# Patient Record
Sex: Female | Born: 1950 | Race: White | Hispanic: No | Marital: Married
Health system: Southern US, Community
[De-identification: ages and names within clinical notes are randomized; demographics above are authoritative.]

---

## 2017-04-26 DIAGNOSIS — H40013 Open angle with borderline findings, low risk, bilateral: Secondary | ICD-10-CM | POA: Diagnosis not present

## 2017-07-11 DIAGNOSIS — H401131 Primary open-angle glaucoma, bilateral, mild stage: Secondary | ICD-10-CM | POA: Diagnosis not present

## 2017-07-30 DIAGNOSIS — H401131 Primary open-angle glaucoma, bilateral, mild stage: Secondary | ICD-10-CM | POA: Diagnosis not present

## 2017-10-29 DIAGNOSIS — H401131 Primary open-angle glaucoma, bilateral, mild stage: Secondary | ICD-10-CM | POA: Diagnosis not present

## 2018-04-15 ENCOUNTER — Other Ambulatory Visit: Payer: Self-pay | Admitting: Family Medicine

## 2018-04-15 ENCOUNTER — Other Ambulatory Visit (HOSPITAL_COMMUNITY)
Admission: RE | Admit: 2018-04-15 | Discharge: 2018-04-15 | Disposition: A | Discharge status: Home / Self Care | Payer: Medicare Other | Source: Ambulatory Visit | Attending: Family Medicine | Admitting: Family Medicine

## 2018-04-15 DIAGNOSIS — Z01411 Encounter for gynecological examination (general) (routine) with abnormal findings: Secondary | ICD-10-CM | POA: Insufficient documentation

## 2018-04-15 DIAGNOSIS — E2839 Other primary ovarian failure: Secondary | ICD-10-CM

## 2018-04-15 DIAGNOSIS — E041 Nontoxic single thyroid nodule: Secondary | ICD-10-CM | POA: Diagnosis not present

## 2018-04-15 DIAGNOSIS — Z1231 Encounter for screening mammogram for malignant neoplasm of breast: Secondary | ICD-10-CM

## 2018-04-15 DIAGNOSIS — Z0000 Encounter for general adult medical examination without abnormal findings: Secondary | ICD-10-CM | POA: Diagnosis not present

## 2018-04-15 DIAGNOSIS — Z23 Encounter for immunization: Secondary | ICD-10-CM | POA: Diagnosis not present

## 2018-04-15 DIAGNOSIS — Z136 Encounter for screening for cardiovascular disorders: Secondary | ICD-10-CM | POA: Diagnosis not present

## 2018-04-18 LAB — CYTOLOGY - PAP
Diagnosis: NEGATIVE
HPV: NOT DETECTED

## 2018-06-05 ENCOUNTER — Ambulatory Visit: Payer: Self-pay

## 2018-06-05 ENCOUNTER — Other Ambulatory Visit: Payer: Self-pay

## 2018-08-11 ENCOUNTER — Other Ambulatory Visit: Payer: 59

## 2018-08-11 ENCOUNTER — Ambulatory Visit: Payer: 59

## 2018-09-01 DIAGNOSIS — H401131 Primary open-angle glaucoma, bilateral, mild stage: Secondary | ICD-10-CM | POA: Diagnosis not present

## 2018-10-06 ENCOUNTER — Other Ambulatory Visit: Payer: Self-pay

## 2018-10-06 ENCOUNTER — Ambulatory Visit
Admission: RE | Admit: 2018-10-06 | Discharge: 2018-10-06 | Disposition: A | Discharge status: Home / Self Care | Payer: 59 | Source: Ambulatory Visit | Attending: Family Medicine | Admitting: Family Medicine

## 2018-10-06 DIAGNOSIS — M85852 Other specified disorders of bone density and structure, left thigh: Secondary | ICD-10-CM | POA: Diagnosis not present

## 2018-10-06 DIAGNOSIS — Z1231 Encounter for screening mammogram for malignant neoplasm of breast: Secondary | ICD-10-CM | POA: Diagnosis not present

## 2018-10-06 DIAGNOSIS — E2839 Other primary ovarian failure: Secondary | ICD-10-CM

## 2019-01-06 DIAGNOSIS — Z23 Encounter for immunization: Secondary | ICD-10-CM | POA: Diagnosis not present

## 2019-02-03 DIAGNOSIS — H2513 Age-related nuclear cataract, bilateral: Secondary | ICD-10-CM | POA: Diagnosis not present

## 2019-02-03 DIAGNOSIS — H401131 Primary open-angle glaucoma, bilateral, mild stage: Secondary | ICD-10-CM | POA: Diagnosis not present

## 2019-02-03 DIAGNOSIS — M7989 Other specified soft tissue disorders: Secondary | ICD-10-CM | POA: Diagnosis not present

## 2019-02-09 DIAGNOSIS — L608 Other nail disorders: Secondary | ICD-10-CM | POA: Diagnosis not present

## 2019-02-09 DIAGNOSIS — R200 Anesthesia of skin: Secondary | ICD-10-CM | POA: Diagnosis not present

## 2019-02-11 ENCOUNTER — Other Ambulatory Visit: Payer: Self-pay | Admitting: Orthopedic Surgery

## 2019-02-11 DIAGNOSIS — L608 Other nail disorders: Secondary | ICD-10-CM

## 2019-03-04 ENCOUNTER — Other Ambulatory Visit: Payer: PRIVATE HEALTH INSURANCE

## 2019-03-20 ENCOUNTER — Other Ambulatory Visit: Payer: Self-pay

## 2019-03-20 ENCOUNTER — Ambulatory Visit
Admission: RE | Admit: 2019-03-20 | Discharge: 2019-03-20 | Disposition: A | Discharge status: Home / Self Care | Payer: PRIVATE HEALTH INSURANCE | Source: Ambulatory Visit | Attending: Orthopedic Surgery | Admitting: Orthopedic Surgery

## 2019-03-20 DIAGNOSIS — L608 Other nail disorders: Secondary | ICD-10-CM

## 2019-03-20 MED ORDER — GADOBENATE DIMEGLUMINE 529 MG/ML IV SOLN
10.0000 mL | Freq: Once | INTRAVENOUS | Status: AC | PRN
  Administered 2019-03-20: 17:00:00 10 mL via INTRAVENOUS

## 2019-04-24 DIAGNOSIS — Z1211 Encounter for screening for malignant neoplasm of colon: Secondary | ICD-10-CM | POA: Diagnosis not present

## 2019-04-24 DIAGNOSIS — E7800 Pure hypercholesterolemia, unspecified: Secondary | ICD-10-CM | POA: Diagnosis not present

## 2019-04-24 DIAGNOSIS — Z0000 Encounter for general adult medical examination without abnormal findings: Secondary | ICD-10-CM | POA: Diagnosis not present

## 2019-04-24 DIAGNOSIS — Z79899 Other long term (current) drug therapy: Secondary | ICD-10-CM | POA: Diagnosis not present

## 2019-04-24 DIAGNOSIS — E041 Nontoxic single thyroid nodule: Secondary | ICD-10-CM | POA: Diagnosis not present

## 2019-05-08 DIAGNOSIS — Z79899 Other long term (current) drug therapy: Secondary | ICD-10-CM | POA: Diagnosis not present

## 2019-05-08 DIAGNOSIS — E041 Nontoxic single thyroid nodule: Secondary | ICD-10-CM | POA: Diagnosis not present

## 2019-05-08 DIAGNOSIS — E7800 Pure hypercholesterolemia, unspecified: Secondary | ICD-10-CM | POA: Diagnosis not present

## 2019-05-14 ENCOUNTER — Ambulatory Visit: Payer: PRIVATE HEALTH INSURANCE

## 2019-05-19 ENCOUNTER — Ambulatory Visit: Payer: PRIVATE HEALTH INSURANCE

## 2019-05-22 ENCOUNTER — Ambulatory Visit: Payer: PRIVATE HEALTH INSURANCE | Attending: Internal Medicine

## 2019-05-22 DIAGNOSIS — Z23 Encounter for immunization: Secondary | ICD-10-CM | POA: Insufficient documentation

## 2019-05-22 NOTE — Progress Notes (Signed)
   Covid-19 Vaccination Clinic  Name:  Allison Montgomery    MRN: [id number] DOB: 1951-02-20  05/22/2019  Allison Montgomery was observed post Covid-19 immunization for 15 minutes without incidence. She was provided with Vaccine Information Sheet and instruction to access the V-Safe system.   Allison Montgomery was instructed to call 911 with any severe reactions post vaccine: Marland Kitchen Difficulty breathing  . Swelling of your face and throat  . A fast heartbeat  . A bad rash all over your body  . Dizziness and weakness    Immunizations Administered    Name Date Dose VIS Date Route   Pfizer COVID-19 Vaccine 05/22/2019  4:33 PM 0.3 mL 03/27/2019 Intramuscular   Manufacturer: [place]   Lot: [id number]   [place]: [id number]

## 2019-06-01 DIAGNOSIS — R200 Anesthesia of skin: Secondary | ICD-10-CM | POA: Diagnosis not present

## 2019-06-01 DIAGNOSIS — L608 Other nail disorders: Secondary | ICD-10-CM | POA: Diagnosis not present

## 2019-06-16 ENCOUNTER — Ambulatory Visit: Payer: PRIVATE HEALTH INSURANCE | Attending: Internal Medicine

## 2019-06-16 DIAGNOSIS — Z23 Encounter for immunization: Secondary | ICD-10-CM | POA: Insufficient documentation

## 2019-06-16 NOTE — Progress Notes (Signed)
   Covid-19 Vaccination Clinic  Name:  Equilla Pyland    MRN: [id number] DOB: Oct 10, 1950  06/16/2019  Ms. Quest was observed post Covid-19 immunization for 15 minutes without incident. She was provided with Vaccine Information Sheet and instruction to access the V-Safe system.   Ms. Rynearson was instructed to call 911 with any severe reactions post vaccine: Marland Kitchen Difficulty breathing  . Swelling of face and throat  . A fast heartbeat  . A bad rash all over body  . Dizziness and weakness   Immunizations Administered    Name Date Dose VIS Date Route   Pfizer COVID-19 Vaccine 06/16/2019  3:59 PM 0.3 mL 03/27/2019 Intramuscular   Manufacturer: [place]   Lot: [id number]   [place]: [id number]

## 2019-07-06 DIAGNOSIS — Z1211 Encounter for screening for malignant neoplasm of colon: Secondary | ICD-10-CM | POA: Diagnosis not present

## 2019-07-06 DIAGNOSIS — R7401 Elevation of levels of liver transaminase levels: Secondary | ICD-10-CM | POA: Diagnosis not present

## 2019-12-29 DIAGNOSIS — L089 Local infection of the skin and subcutaneous tissue, unspecified: Secondary | ICD-10-CM | POA: Diagnosis not present

## 2019-12-29 DIAGNOSIS — L723 Sebaceous cyst: Secondary | ICD-10-CM | POA: Diagnosis not present

## 2020-01-07 DIAGNOSIS — Z23 Encounter for immunization: Secondary | ICD-10-CM | POA: Diagnosis not present

## 2020-01-09 DIAGNOSIS — Z23 Encounter for immunization: Secondary | ICD-10-CM | POA: Diagnosis not present

## 2020-02-16 DIAGNOSIS — H401131 Primary open-angle glaucoma, bilateral, mild stage: Secondary | ICD-10-CM | POA: Diagnosis not present

## 2020-04-25 DIAGNOSIS — Z1159 Encounter for screening for other viral diseases: Secondary | ICD-10-CM | POA: Diagnosis not present

## 2020-04-25 DIAGNOSIS — Z79899 Other long term (current) drug therapy: Secondary | ICD-10-CM | POA: Diagnosis not present

## 2020-04-25 DIAGNOSIS — Z23 Encounter for immunization: Secondary | ICD-10-CM | POA: Diagnosis not present

## 2020-04-25 DIAGNOSIS — Z0000 Encounter for general adult medical examination without abnormal findings: Secondary | ICD-10-CM | POA: Diagnosis not present

## 2020-04-25 DIAGNOSIS — E7800 Pure hypercholesterolemia, unspecified: Secondary | ICD-10-CM | POA: Diagnosis not present

## 2020-04-29 DIAGNOSIS — H04122 Dry eye syndrome of left lacrimal gland: Secondary | ICD-10-CM | POA: Diagnosis not present

## 2020-04-29 DIAGNOSIS — H401131 Primary open-angle glaucoma, bilateral, mild stage: Secondary | ICD-10-CM | POA: Diagnosis not present

## 2020-04-29 DIAGNOSIS — H2513 Age-related nuclear cataract, bilateral: Secondary | ICD-10-CM | POA: Diagnosis not present

## 2020-05-09 DIAGNOSIS — Z1211 Encounter for screening for malignant neoplasm of colon: Secondary | ICD-10-CM | POA: Diagnosis not present

## 2020-07-14 DIAGNOSIS — L237 Allergic contact dermatitis due to plants, except food: Secondary | ICD-10-CM | POA: Diagnosis not present

## 2020-07-26 DIAGNOSIS — H524 Presbyopia: Secondary | ICD-10-CM | POA: Diagnosis not present

## 2020-07-26 DIAGNOSIS — H33322 Round hole, left eye: Secondary | ICD-10-CM | POA: Diagnosis not present

## 2020-07-26 DIAGNOSIS — H52223 Regular astigmatism, bilateral: Secondary | ICD-10-CM | POA: Diagnosis not present

## 2020-07-26 DIAGNOSIS — H401131 Primary open-angle glaucoma, bilateral, mild stage: Secondary | ICD-10-CM | POA: Diagnosis not present

## 2020-08-02 DIAGNOSIS — H35362 Drusen (degenerative) of macula, left eye: Secondary | ICD-10-CM | POA: Diagnosis not present

## 2020-08-02 DIAGNOSIS — H43813 Vitreous degeneration, bilateral: Secondary | ICD-10-CM | POA: Diagnosis not present

## 2020-08-02 DIAGNOSIS — H33322 Round hole, left eye: Secondary | ICD-10-CM | POA: Diagnosis not present

## 2020-08-02 DIAGNOSIS — H2513 Age-related nuclear cataract, bilateral: Secondary | ICD-10-CM | POA: Diagnosis not present

## 2020-08-09 DIAGNOSIS — H33322 Round hole, left eye: Secondary | ICD-10-CM | POA: Diagnosis not present

## 2020-08-26 DIAGNOSIS — Z23 Encounter for immunization: Secondary | ICD-10-CM | POA: Diagnosis not present

## 2020-09-05 IMAGING — MR MR [PERSON_NAME]*[PERSON_NAME]* WO/W CM
9 series · 40 of 40 positions shown · IV contrast (multihance)
Comparison: None.

CLINICAL DATA: Redness and swelling of the nail bed of the right
middle finger since December 2018.

EXAM:
MRI OF THE RIGHT FINGERS WITHOUT AND WITH CONTRAST
TECHNIQUE: Multiplanar, multisequence MR imaging of the right middle finger was
performed before and after the administration of intravenous
contrast.
CONTRAST:  10mL MULTIHANCE GADOBENATE DIMEGLUMINE 529 MG/ML IV SOLN

[Series 4: T1 · coronal · right · 2.0mm · 0.43mm/px · 2 of 15 slices shown]
[im 1/15]
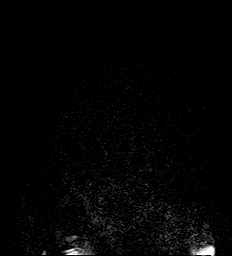
[im 15/15]
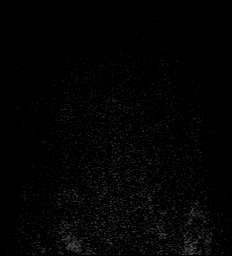

[Series 5: T2 fat-sat · coronal · right · 2.0mm · 0.21mm/px · 2 of 15 slices shown (1 of 3)]
[im 1/15]
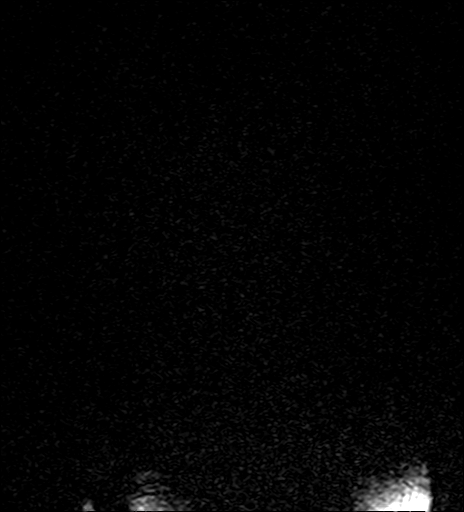
[im 15/15]
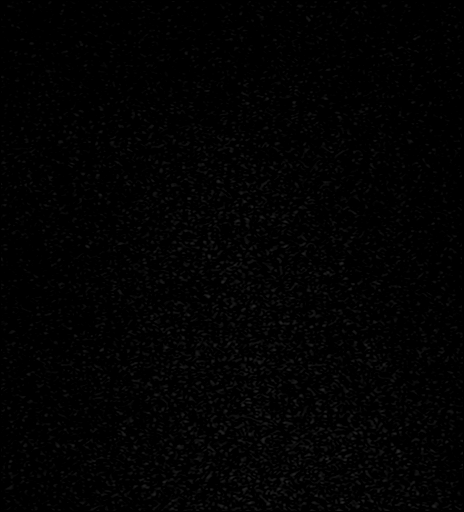

[Series 6: PD fat-sat · sagittal · right · 2.0mm · 0.34mm/px · 4 of 27 slices shown]
[im 1/27]
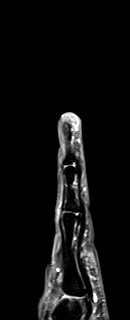
[im 9/27]
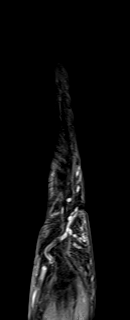
[im 18/27]
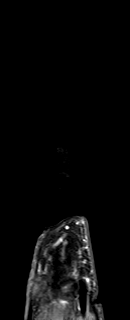
[im 27/27]
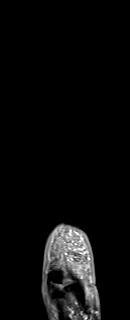

[Series 7: t1_ax · axial · right · 2.0mm · 0.31mm/px · z∈[-21,+67]mm · 6 of 40 slices shown]
[im 1/40]
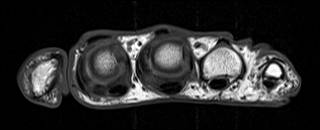
[im 8/40]
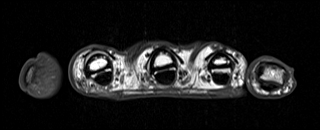
[im 16/40]
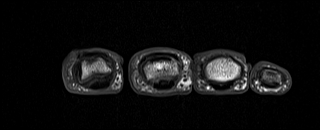
[im 24/40]
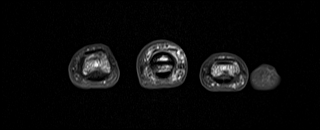
[im 32/40]
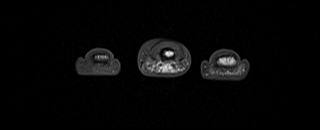
[im 40/40]
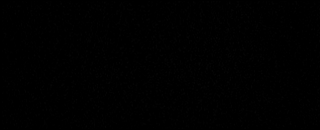

[Series 8: T2 fat-sat · axial · right · 2.0mm · 0.31mm/px · z∈[-21,+65]mm · 6 of 39 slices shown (2 of 3)]
[im 1/39]
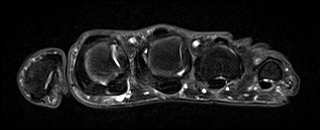
[im 8/39]
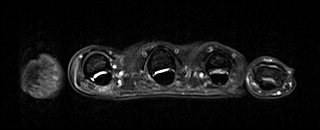
[im 16/39]
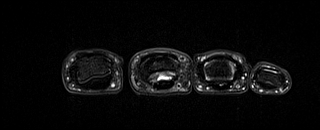
[im 23/39]
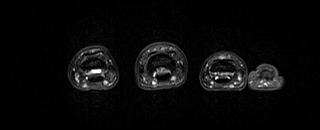
[im 31/39]
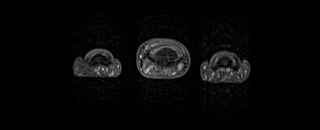
[im 39/39]
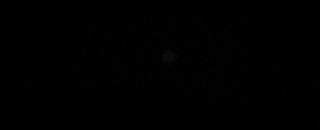

[Series 9: t1_ax fs pre · axial · non-contrast · right · 2.0mm · 0.39mm/px · z∈[-21,+67]mm · 6 of 40 slices shown]
[im 1/40]
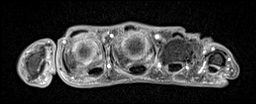
[im 8/40]
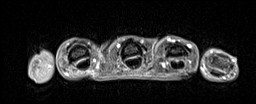
[im 16/40]
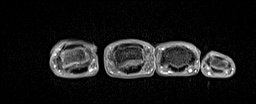
[im 24/40]
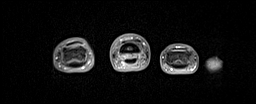
[im 32/40]
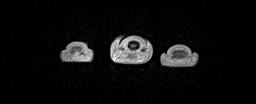
[im 40/40]
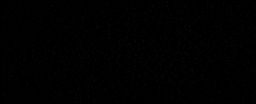

[Series 10: T2 fat-sat · sagittal · right · 1.0mm · 0.21mm/px · 4 of 24 slices shown (3 of 3)]
[im 1/24]
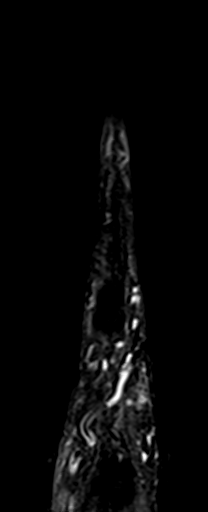
[im 8/24]
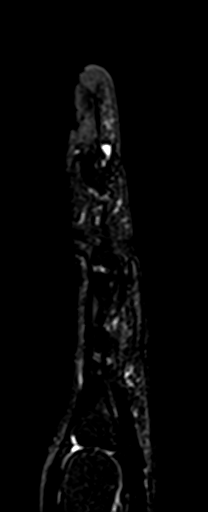
[im 16/24]
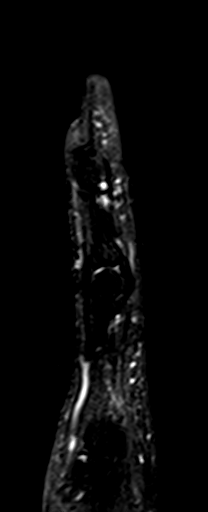
[im 24/24]
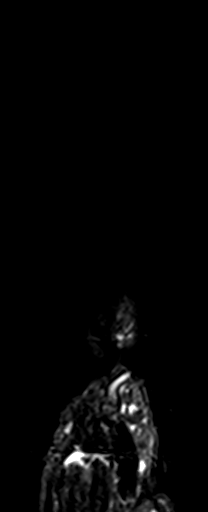

[Series 11: t1_ax fs post · axial · right · 2.0mm · 0.39mm/px · z∈[-21,+67]mm · 6 of 40 slices shown]
[im 1/40]
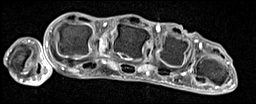
[im 8/40]
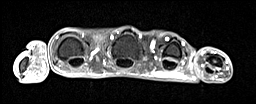
[im 16/40]
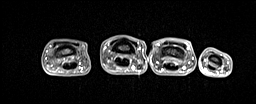
[im 24/40]
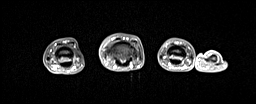
[im 32/40]
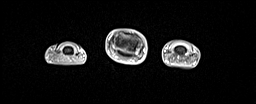
[im 40/40]
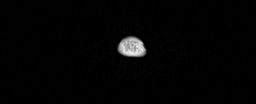

[Series 12: T1 fat-sat · sagittal · right · 2.0mm · 0.43mm/px · 4 of 26 slices shown]
[im 1/26]
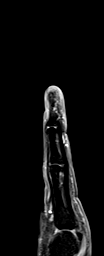
[im 9/26]
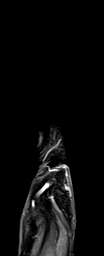
[im 17/26]
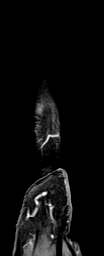
[im 26/26]
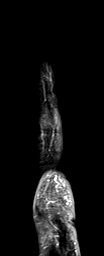

[40 of 40 positions shown; findings below may reference images not displayed]

FINDINGS: The bony structures are unremarkable. I do not see any findings
suspicious for osteomyelitis or bone lesion. There are moderate DIP
joint degenerative changes involving the long finger with joint
space narrowing and spurring.

The flexor and extensor tendons are intact.

There is mild soft tissue swelling at the base of the nail bed of
the right middle finger on the radial side and subsequent probable
enhancing granulation tissue. I do not see any evidence of an
abscess or discrete mass or mass effect.

The visualized flexor pulleys appear intact. No tenosynovitis or
significant tendinopathy.
IMPRESSION: 1. Nonspecific soft tissue thickening involving the nailbed of the
middle finger on the radial side. Suspect enhancing granulation
tissue but no discrete cyst, mass or abscess.
2. Moderate degenerative changes at the DIP joint of the long
finger.
3. Intact flexor and extensor tendons.

## 2020-09-20 DIAGNOSIS — H2513 Age-related nuclear cataract, bilateral: Secondary | ICD-10-CM | POA: Diagnosis not present

## 2020-09-20 DIAGNOSIS — H31092 Other chorioretinal scars, left eye: Secondary | ICD-10-CM | POA: Diagnosis not present

## 2020-09-20 DIAGNOSIS — H35362 Drusen (degenerative) of macula, left eye: Secondary | ICD-10-CM | POA: Diagnosis not present

## 2020-09-20 DIAGNOSIS — H43813 Vitreous degeneration, bilateral: Secondary | ICD-10-CM | POA: Diagnosis not present

## 2021-01-18 DIAGNOSIS — Z23 Encounter for immunization: Secondary | ICD-10-CM | POA: Diagnosis not present

## 2021-01-27 DIAGNOSIS — H1789 Other corneal scars and opacities: Secondary | ICD-10-CM | POA: Diagnosis not present

## 2021-01-27 DIAGNOSIS — H401131 Primary open-angle glaucoma, bilateral, mild stage: Secondary | ICD-10-CM | POA: Diagnosis not present

## 2021-04-28 DIAGNOSIS — E041 Nontoxic single thyroid nodule: Secondary | ICD-10-CM | POA: Diagnosis not present

## 2021-04-28 DIAGNOSIS — E2839 Other primary ovarian failure: Secondary | ICD-10-CM | POA: Diagnosis not present

## 2021-04-28 DIAGNOSIS — Z79899 Other long term (current) drug therapy: Secondary | ICD-10-CM | POA: Diagnosis not present

## 2021-04-28 DIAGNOSIS — E7800 Pure hypercholesterolemia, unspecified: Secondary | ICD-10-CM | POA: Diagnosis not present

## 2021-04-28 DIAGNOSIS — Z0001 Encounter for general adult medical examination with abnormal findings: Secondary | ICD-10-CM | POA: Diagnosis not present

## 2021-05-03 ENCOUNTER — Other Ambulatory Visit: Payer: Self-pay | Admitting: Family Medicine

## 2021-05-03 DIAGNOSIS — E2839 Other primary ovarian failure: Secondary | ICD-10-CM

## 2021-05-03 DIAGNOSIS — Z1231 Encounter for screening mammogram for malignant neoplasm of breast: Secondary | ICD-10-CM

## 2021-05-04 DIAGNOSIS — Z1211 Encounter for screening for malignant neoplasm of colon: Secondary | ICD-10-CM | POA: Diagnosis not present

## 2021-07-18 DIAGNOSIS — L821 Other seborrheic keratosis: Secondary | ICD-10-CM | POA: Diagnosis not present

## 2021-07-18 DIAGNOSIS — L814 Other melanin hyperpigmentation: Secondary | ICD-10-CM | POA: Diagnosis not present

## 2021-07-18 DIAGNOSIS — Z85828 Personal history of other malignant neoplasm of skin: Secondary | ICD-10-CM | POA: Diagnosis not present

## 2021-07-18 DIAGNOSIS — L570 Actinic keratosis: Secondary | ICD-10-CM | POA: Diagnosis not present

## 2021-07-18 DIAGNOSIS — Z08 Encounter for follow-up examination after completed treatment for malignant neoplasm: Secondary | ICD-10-CM | POA: Diagnosis not present

## 2021-07-18 DIAGNOSIS — D1801 Hemangioma of skin and subcutaneous tissue: Secondary | ICD-10-CM | POA: Diagnosis not present

## 2021-08-17 DIAGNOSIS — H401131 Primary open-angle glaucoma, bilateral, mild stage: Secondary | ICD-10-CM | POA: Diagnosis not present

## 2021-08-17 DIAGNOSIS — H52223 Regular astigmatism, bilateral: Secondary | ICD-10-CM | POA: Diagnosis not present

## 2021-08-17 DIAGNOSIS — H33322 Round hole, left eye: Secondary | ICD-10-CM | POA: Diagnosis not present

## 2021-08-17 DIAGNOSIS — H04123 Dry eye syndrome of bilateral lacrimal glands: Secondary | ICD-10-CM | POA: Diagnosis not present

## 2021-08-17 DIAGNOSIS — H2513 Age-related nuclear cataract, bilateral: Secondary | ICD-10-CM | POA: Diagnosis not present

## 2021-09-14 ENCOUNTER — Ambulatory Visit: Payer: PRIVATE HEALTH INSURANCE

## 2021-09-14 ENCOUNTER — Other Ambulatory Visit: Payer: PRIVATE HEALTH INSURANCE

## 2021-11-16 DIAGNOSIS — L255 Unspecified contact dermatitis due to plants, except food: Secondary | ICD-10-CM | POA: Diagnosis not present

## 2022-01-05 ENCOUNTER — Ambulatory Visit
Admission: RE | Admit: 2022-01-05 | Discharge: 2022-01-05 | Disposition: A | Discharge status: Home / Self Care | Payer: PRIVATE HEALTH INSURANCE | Source: Ambulatory Visit | Attending: Family Medicine | Admitting: Family Medicine

## 2022-01-05 DIAGNOSIS — Z1231 Encounter for screening mammogram for malignant neoplasm of breast: Secondary | ICD-10-CM

## 2022-01-05 DIAGNOSIS — Z780 Asymptomatic menopausal state: Secondary | ICD-10-CM | POA: Diagnosis not present

## 2022-01-05 DIAGNOSIS — M8589 Other specified disorders of bone density and structure, multiple sites: Secondary | ICD-10-CM | POA: Diagnosis not present

## 2022-01-05 DIAGNOSIS — E2839 Other primary ovarian failure: Secondary | ICD-10-CM

## 2022-01-08 ENCOUNTER — Other Ambulatory Visit: Payer: Self-pay | Admitting: Family Medicine

## 2022-01-08 DIAGNOSIS — R928 Other abnormal and inconclusive findings on diagnostic imaging of breast: Secondary | ICD-10-CM

## 2022-01-18 ENCOUNTER — Ambulatory Visit: Payer: PRIVATE HEALTH INSURANCE

## 2022-01-18 ENCOUNTER — Ambulatory Visit
Admission: RE | Admit: 2022-01-18 | Discharge: 2022-01-18 | Disposition: A | Discharge status: Home / Self Care | Payer: PRIVATE HEALTH INSURANCE | Source: Ambulatory Visit | Attending: Family Medicine | Admitting: Family Medicine

## 2022-01-18 DIAGNOSIS — R928 Other abnormal and inconclusive findings on diagnostic imaging of breast: Secondary | ICD-10-CM

## 2022-01-21 DIAGNOSIS — Z23 Encounter for immunization: Secondary | ICD-10-CM | POA: Diagnosis not present

## 2022-02-20 DIAGNOSIS — H04123 Dry eye syndrome of bilateral lacrimal glands: Secondary | ICD-10-CM | POA: Diagnosis not present

## 2022-02-20 DIAGNOSIS — H33322 Round hole, left eye: Secondary | ICD-10-CM | POA: Diagnosis not present

## 2022-02-20 DIAGNOSIS — H401131 Primary open-angle glaucoma, bilateral, mild stage: Secondary | ICD-10-CM | POA: Diagnosis not present

## 2022-02-20 DIAGNOSIS — H2513 Age-related nuclear cataract, bilateral: Secondary | ICD-10-CM | POA: Diagnosis not present

## 2022-05-02 DIAGNOSIS — Z1211 Encounter for screening for malignant neoplasm of colon: Secondary | ICD-10-CM | POA: Diagnosis not present

## 2022-05-02 DIAGNOSIS — R519 Headache, unspecified: Secondary | ICD-10-CM | POA: Diagnosis not present

## 2022-05-02 DIAGNOSIS — E7800 Pure hypercholesterolemia, unspecified: Secondary | ICD-10-CM | POA: Diagnosis not present

## 2022-05-02 DIAGNOSIS — Z79899 Other long term (current) drug therapy: Secondary | ICD-10-CM | POA: Diagnosis not present

## 2022-05-02 DIAGNOSIS — Z0001 Encounter for general adult medical examination with abnormal findings: Secondary | ICD-10-CM | POA: Diagnosis not present

## 2022-05-09 ENCOUNTER — Other Ambulatory Visit: Payer: Self-pay | Admitting: Family Medicine

## 2022-05-09 DIAGNOSIS — R519 Headache, unspecified: Secondary | ICD-10-CM

## 2022-05-17 DIAGNOSIS — Z1211 Encounter for screening for malignant neoplasm of colon: Secondary | ICD-10-CM | POA: Diagnosis not present

## 2022-06-02 ENCOUNTER — Ambulatory Visit
Admission: RE | Admit: 2022-06-02 | Discharge: 2022-06-02 | Disposition: A | Discharge status: Home / Self Care | Payer: PRIVATE HEALTH INSURANCE | Source: Ambulatory Visit | Attending: Family Medicine | Admitting: Family Medicine

## 2022-06-02 DIAGNOSIS — R519 Headache, unspecified: Secondary | ICD-10-CM | POA: Diagnosis not present

## 2022-06-02 MED ORDER — GADOPICLENOL 0.5 MMOL/ML IV SOLN
5.0000 mL | Freq: Once | INTRAVENOUS | Status: AC | PRN
  Administered 2022-06-02: 5 mL via INTRAVENOUS

## 2022-07-18 DIAGNOSIS — Z23 Encounter for immunization: Secondary | ICD-10-CM | POA: Diagnosis not present

## 2022-07-19 DIAGNOSIS — L570 Actinic keratosis: Secondary | ICD-10-CM | POA: Diagnosis not present

## 2022-07-19 DIAGNOSIS — L821 Other seborrheic keratosis: Secondary | ICD-10-CM | POA: Diagnosis not present

## 2022-07-19 DIAGNOSIS — D1801 Hemangioma of skin and subcutaneous tissue: Secondary | ICD-10-CM | POA: Diagnosis not present

## 2022-07-19 DIAGNOSIS — Z08 Encounter for follow-up examination after completed treatment for malignant neoplasm: Secondary | ICD-10-CM | POA: Diagnosis not present

## 2022-07-19 DIAGNOSIS — L814 Other melanin hyperpigmentation: Secondary | ICD-10-CM | POA: Diagnosis not present

## 2022-07-19 DIAGNOSIS — Z85828 Personal history of other malignant neoplasm of skin: Secondary | ICD-10-CM | POA: Diagnosis not present

## 2022-09-11 DIAGNOSIS — H52223 Regular astigmatism, bilateral: Secondary | ICD-10-CM | POA: Diagnosis not present

## 2022-09-11 DIAGNOSIS — H33322 Round hole, left eye: Secondary | ICD-10-CM | POA: Diagnosis not present

## 2022-09-11 DIAGNOSIS — H524 Presbyopia: Secondary | ICD-10-CM | POA: Diagnosis not present

## 2022-09-11 DIAGNOSIS — H2513 Age-related nuclear cataract, bilateral: Secondary | ICD-10-CM | POA: Diagnosis not present

## 2022-09-11 DIAGNOSIS — H04123 Dry eye syndrome of bilateral lacrimal glands: Secondary | ICD-10-CM | POA: Diagnosis not present

## 2022-09-11 DIAGNOSIS — H401131 Primary open-angle glaucoma, bilateral, mild stage: Secondary | ICD-10-CM | POA: Diagnosis not present

## 2022-10-15 DIAGNOSIS — D485 Neoplasm of uncertain behavior of skin: Secondary | ICD-10-CM | POA: Diagnosis not present

## 2022-10-15 DIAGNOSIS — C44321 Squamous cell carcinoma of skin of nose: Secondary | ICD-10-CM | POA: Diagnosis not present

## 2022-10-15 DIAGNOSIS — C44311 Basal cell carcinoma of skin of nose: Secondary | ICD-10-CM | POA: Diagnosis not present

## 2022-10-30 DIAGNOSIS — D0439 Carcinoma in situ of skin of other parts of face: Secondary | ICD-10-CM | POA: Diagnosis not present

## 2022-11-29 DIAGNOSIS — C44311 Basal cell carcinoma of skin of nose: Secondary | ICD-10-CM | POA: Diagnosis not present

## 2023-01-25 DIAGNOSIS — Z23 Encounter for immunization: Secondary | ICD-10-CM | POA: Diagnosis not present

## 2023-03-20 DIAGNOSIS — H2513 Age-related nuclear cataract, bilateral: Secondary | ICD-10-CM | POA: Diagnosis not present

## 2023-03-20 DIAGNOSIS — H04123 Dry eye syndrome of bilateral lacrimal glands: Secondary | ICD-10-CM | POA: Diagnosis not present

## 2023-03-20 DIAGNOSIS — H33322 Round hole, left eye: Secondary | ICD-10-CM | POA: Diagnosis not present

## 2023-03-20 DIAGNOSIS — H401131 Primary open-angle glaucoma, bilateral, mild stage: Secondary | ICD-10-CM | POA: Diagnosis not present

## 2023-05-09 DIAGNOSIS — Z1331 Encounter for screening for depression: Secondary | ICD-10-CM | POA: Diagnosis not present

## 2023-05-09 DIAGNOSIS — Z79899 Other long term (current) drug therapy: Secondary | ICD-10-CM | POA: Diagnosis not present

## 2023-05-09 DIAGNOSIS — E2839 Other primary ovarian failure: Secondary | ICD-10-CM | POA: Diagnosis not present

## 2023-05-09 DIAGNOSIS — Z0001 Encounter for general adult medical examination with abnormal findings: Secondary | ICD-10-CM | POA: Diagnosis not present

## 2023-05-09 DIAGNOSIS — Z1211 Encounter for screening for malignant neoplasm of colon: Secondary | ICD-10-CM | POA: Diagnosis not present

## 2023-05-09 DIAGNOSIS — E7800 Pure hypercholesterolemia, unspecified: Secondary | ICD-10-CM | POA: Diagnosis not present

## 2023-05-10 ENCOUNTER — Other Ambulatory Visit: Payer: Self-pay | Admitting: Family Medicine

## 2023-05-10 DIAGNOSIS — E2839 Other primary ovarian failure: Secondary | ICD-10-CM

## 2023-05-20 DIAGNOSIS — Z1211 Encounter for screening for malignant neoplasm of colon: Secondary | ICD-10-CM | POA: Diagnosis not present

## 2023-07-23 DIAGNOSIS — L821 Other seborrheic keratosis: Secondary | ICD-10-CM | POA: Diagnosis not present

## 2023-07-23 DIAGNOSIS — L814 Other melanin hyperpigmentation: Secondary | ICD-10-CM | POA: Diagnosis not present

## 2023-07-23 DIAGNOSIS — Z85828 Personal history of other malignant neoplasm of skin: Secondary | ICD-10-CM | POA: Diagnosis not present

## 2023-07-23 DIAGNOSIS — L570 Actinic keratosis: Secondary | ICD-10-CM | POA: Diagnosis not present

## 2023-07-23 DIAGNOSIS — Z08 Encounter for follow-up examination after completed treatment for malignant neoplasm: Secondary | ICD-10-CM | POA: Diagnosis not present

## 2023-07-23 DIAGNOSIS — D225 Melanocytic nevi of trunk: Secondary | ICD-10-CM | POA: Diagnosis not present

## 2023-08-14 DIAGNOSIS — E7800 Pure hypercholesterolemia, unspecified: Secondary | ICD-10-CM | POA: Diagnosis not present

## 2023-08-16 DIAGNOSIS — Z23 Encounter for immunization: Secondary | ICD-10-CM | POA: Diagnosis not present

## 2023-09-14 DIAGNOSIS — E7800 Pure hypercholesterolemia, unspecified: Secondary | ICD-10-CM | POA: Diagnosis not present

## 2023-09-18 DIAGNOSIS — H1789 Other corneal scars and opacities: Secondary | ICD-10-CM | POA: Diagnosis not present

## 2023-09-18 DIAGNOSIS — H353131 Nonexudative age-related macular degeneration, bilateral, early dry stage: Secondary | ICD-10-CM | POA: Diagnosis not present

## 2023-09-18 DIAGNOSIS — H52223 Regular astigmatism, bilateral: Secondary | ICD-10-CM | POA: Diagnosis not present

## 2023-09-18 DIAGNOSIS — H524 Presbyopia: Secondary | ICD-10-CM | POA: Diagnosis not present

## 2023-09-18 DIAGNOSIS — H2513 Age-related nuclear cataract, bilateral: Secondary | ICD-10-CM | POA: Diagnosis not present

## 2023-09-18 DIAGNOSIS — H401131 Primary open-angle glaucoma, bilateral, mild stage: Secondary | ICD-10-CM | POA: Diagnosis not present

## 2023-10-03 DIAGNOSIS — M25551 Pain in right hip: Secondary | ICD-10-CM | POA: Diagnosis not present

## 2023-10-03 DIAGNOSIS — M5432 Sciatica, left side: Secondary | ICD-10-CM | POA: Diagnosis not present

## 2023-10-03 DIAGNOSIS — M5431 Sciatica, right side: Secondary | ICD-10-CM | POA: Diagnosis not present

## 2023-10-03 DIAGNOSIS — M5459 Other low back pain: Secondary | ICD-10-CM | POA: Diagnosis not present

## 2023-10-03 DIAGNOSIS — R29898 Other symptoms and signs involving the musculoskeletal system: Secondary | ICD-10-CM | POA: Diagnosis not present

## 2023-10-08 DIAGNOSIS — M545 Low back pain, unspecified: Secondary | ICD-10-CM | POA: Diagnosis not present

## 2023-10-14 DIAGNOSIS — E7800 Pure hypercholesterolemia, unspecified: Secondary | ICD-10-CM | POA: Diagnosis not present

## 2023-10-15 ENCOUNTER — Other Ambulatory Visit: Payer: Self-pay | Admitting: Family Medicine

## 2023-10-15 DIAGNOSIS — R29898 Other symptoms and signs involving the musculoskeletal system: Secondary | ICD-10-CM

## 2023-10-25 ENCOUNTER — Encounter: Payer: Self-pay | Admitting: Family Medicine

## 2023-10-31 ENCOUNTER — Ambulatory Visit
Admission: RE | Admit: 2023-10-31 | Discharge: 2023-10-31 | Disposition: A | Discharge status: Home / Self Care | Payer: PRIVATE HEALTH INSURANCE | Source: Ambulatory Visit | Attending: Family Medicine | Admitting: Family Medicine

## 2023-10-31 DIAGNOSIS — M47816 Spondylosis without myelopathy or radiculopathy, lumbar region: Secondary | ICD-10-CM | POA: Diagnosis not present

## 2023-10-31 DIAGNOSIS — R29898 Other symptoms and signs involving the musculoskeletal system: Secondary | ICD-10-CM

## 2023-10-31 DIAGNOSIS — M48061 Spinal stenosis, lumbar region without neurogenic claudication: Secondary | ICD-10-CM | POA: Diagnosis not present

## 2023-11-14 DIAGNOSIS — E7800 Pure hypercholesterolemia, unspecified: Secondary | ICD-10-CM | POA: Diagnosis not present

## 2023-11-19 DIAGNOSIS — M545 Low back pain, unspecified: Secondary | ICD-10-CM | POA: Diagnosis not present

## 2023-11-29 DIAGNOSIS — M545 Low back pain, unspecified: Secondary | ICD-10-CM | POA: Diagnosis not present

## 2023-12-10 DIAGNOSIS — M545 Low back pain, unspecified: Secondary | ICD-10-CM | POA: Diagnosis not present

## 2023-12-15 DIAGNOSIS — E7800 Pure hypercholesterolemia, unspecified: Secondary | ICD-10-CM | POA: Diagnosis not present

## 2023-12-31 DIAGNOSIS — M533 Sacrococcygeal disorders, not elsewhere classified: Secondary | ICD-10-CM | POA: Diagnosis not present

## 2023-12-31 DIAGNOSIS — M47816 Spondylosis without myelopathy or radiculopathy, lumbar region: Secondary | ICD-10-CM | POA: Diagnosis not present

## 2024-01-14 DIAGNOSIS — M533 Sacrococcygeal disorders, not elsewhere classified: Secondary | ICD-10-CM | POA: Diagnosis not present

## 2024-01-14 DIAGNOSIS — E7800 Pure hypercholesterolemia, unspecified: Secondary | ICD-10-CM | POA: Diagnosis not present

## 2024-01-15 ENCOUNTER — Other Ambulatory Visit: Payer: PRIVATE HEALTH INSURANCE

## 2024-01-16 ENCOUNTER — Other Ambulatory Visit: Payer: Self-pay | Admitting: Medical Genetics

## 2024-01-31 DIAGNOSIS — Z23 Encounter for immunization: Secondary | ICD-10-CM | POA: Diagnosis not present

## 2024-02-13 DIAGNOSIS — M47816 Spondylosis without myelopathy or radiculopathy, lumbar region: Secondary | ICD-10-CM | POA: Diagnosis not present

## 2024-02-13 DIAGNOSIS — M533 Sacrococcygeal disorders, not elsewhere classified: Secondary | ICD-10-CM | POA: Diagnosis not present

## 2024-02-13 DIAGNOSIS — M5459 Other low back pain: Secondary | ICD-10-CM | POA: Diagnosis not present

## 2024-02-14 DIAGNOSIS — E7800 Pure hypercholesterolemia, unspecified: Secondary | ICD-10-CM | POA: Diagnosis not present

## 2024-03-04 ENCOUNTER — Ambulatory Visit (HOSPITAL_BASED_OUTPATIENT_CLINIC_OR_DEPARTMENT_OTHER)
Admission: RE | Admit: 2024-03-04 | Discharge: 2024-03-04 | Disposition: A | Discharge status: Home / Self Care | Payer: PRIVATE HEALTH INSURANCE | Source: Ambulatory Visit | Attending: Family Medicine | Admitting: Family Medicine

## 2024-03-04 DIAGNOSIS — E2839 Other primary ovarian failure: Secondary | ICD-10-CM | POA: Diagnosis not present

## 2024-03-10 ENCOUNTER — Other Ambulatory Visit (HOSPITAL_COMMUNITY)
Admission: RE | Admit: 2024-03-10 | Discharge: 2024-03-10 | Disposition: A | Discharge status: Home / Self Care | Payer: Self-pay | Source: Ambulatory Visit | Attending: Medical Genetics | Admitting: Medical Genetics

## 2024-03-15 DIAGNOSIS — E7800 Pure hypercholesterolemia, unspecified: Secondary | ICD-10-CM | POA: Diagnosis not present

## 2024-03-24 DIAGNOSIS — H33322 Round hole, left eye: Secondary | ICD-10-CM | POA: Diagnosis not present

## 2024-03-24 DIAGNOSIS — H353131 Nonexudative age-related macular degeneration, bilateral, early dry stage: Secondary | ICD-10-CM | POA: Diagnosis not present

## 2024-03-24 DIAGNOSIS — H401131 Primary open-angle glaucoma, bilateral, mild stage: Secondary | ICD-10-CM | POA: Diagnosis not present

## 2024-03-25 LAB — GENECONNECT MOLECULAR SCREEN: Genetic Analysis Overall Interpretation: NEGATIVE
